# Patient Record
Sex: Female | Born: 1995 | Race: Black or African American | Hispanic: No | Marital: Single | Smoking: Never smoker
Health system: Southern US, Community
[De-identification: ages and names within clinical notes are randomized; demographics above are authoritative.]

## PROBLEM LIST (undated history)

## (undated) HISTORY — PX: KNEE SURGERY: SHX244

## (undated) HISTORY — PX: HERNIA REPAIR: SHX51

---
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
## 2016-01-12 ENCOUNTER — Emergency Department (HOSPITAL_BASED_OUTPATIENT_CLINIC_OR_DEPARTMENT_OTHER)
Admission: EM | Admit: 2016-01-12 | Discharge: 2016-01-12 | Disposition: A | Discharge status: Home / Self Care | Payer: Medicaid Other | Attending: Emergency Medicine | Admitting: Emergency Medicine

## 2016-01-12 ENCOUNTER — Encounter (HOSPITAL_BASED_OUTPATIENT_CLINIC_OR_DEPARTMENT_OTHER): Payer: Self-pay

## 2016-01-12 DIAGNOSIS — R21 Rash and other nonspecific skin eruption: Secondary | ICD-10-CM | POA: Insufficient documentation

## 2016-01-12 NOTE — ED Notes (Signed)
Left shoulder rash noted. MD seen and assessed

## 2016-01-12 NOTE — ED Provider Notes (Signed)
CSN: [id number]     Arrival date & time 01/12/16  1551 History  By signing my name below, I, Placido SouLogan Joldersma, attest that this documentation has been prepared under the direction and in the presence of Alvira MondayErin Lanae Federer, MD. Electronically Signed: Placido SouLogan Joldersma, ED Scribe. 01/12/2016. 4:27 PM.   Chief Complaint  Patient presents with  . Rash   The history is provided by the patient. No language interpreter was used.   HPI Comments: Linda French is a 20 y.o. female who presents to the Emergency Department complaining of a worsening, mild, rash to her left shoulder by 1.5 hours. She reports associated, mild, itchiness across the region. Pt denies any recent contacts with similar symptoms. Pt denies any new soaps, detergents or clothing. She denies pain across the rash, fevers, chills, cough, SOB, n/v, rhinorrhea and congestion.  History reviewed. No pertinent past medical history. Past Surgical History  Procedure Laterality Date  . Knee surgery     Family History  Problem Relation Age of Onset  . Sudden death Neg Hx   . Heart attack Neg Hx    Social History  Substance Use Topics  . Smoking status: Never Smoker   . Smokeless tobacco: None  . Alcohol Use: No   OB History    No data available     Review of Systems  Constitutional: Negative for fever and chills.  HENT: Negative for congestion and rhinorrhea.   Respiratory: Negative for cough and shortness of breath.   Gastrointestinal: Negative for nausea and vomiting.  Musculoskeletal: Negative for myalgias.  Skin: Positive for color change and rash.  All other systems reviewed and are negative.   Allergies  Review of patient's allergies indicates no known allergies.  Home Medications   Prior to Admission medications   Not on File   BP 124/74 mmHg  Pulse 88  Temp(Src) 98.8 F (37.1 C) (Oral)  Resp 18  Ht 5\' 1"  (1.549 m)  Wt 127 lb (57.607 kg)  BMI 24.01 kg/m2  SpO2 99%  LMP 01/12/2016    Physical Exam   Constitutional: She is oriented to person, place, and time. She appears well-developed and well-nourished.  HENT:  Head: Normocephalic and atraumatic.  Eyes: EOM are normal.  Neck: Normal range of motion.  Cardiovascular: Normal rate.   Pulses:      Radial pulses are 2+ on the right side, and 2+ on the left side.  Pulmonary/Chest: Effort normal. No respiratory distress.  Abdominal: Soft.  Musculoskeletal: Normal range of motion.  Neurological: She is alert and oriented to person, place, and time.  Skin: Skin is warm and dry. Rash noted. Rash is papular. No erythema.  Small raised papules to left upper arm scattered in groups. No surrounding erythema, fluctuance, scaling or vesicles.   Psychiatric: She has a normal mood and affect.  Nursing note and vitals reviewed.   ED Course  Procedures  DIAGNOSTIC STUDIES: Oxygen Saturation is 99% on RA, normal by my interpretation.    COORDINATION OF CARE: 4:24 PM Discussed next steps with pt. Return precautions noted. Pt verbalized understanding and is agreeable with the plan.   Labs Review Labs Reviewed - No data to display  Imaging Review No results found.   EKG Interpretation None      MDM   Final diagnoses:  Rash    20 year old female with no significant medical history presents with concern of rash for 1.5hr. Rash does not have the appearance of SSS, TEN, erythroderma, scabies, RMSF or hives.  Not classic appearance of heat rash. Possible insect bite vs contact dermatitis.  Recommend continued monitoring, prn benadryl. Patient discharged in stable condition with understanding of reasons to return.   I personally performed the services described in this documentation, which was scribed in my presence. The recorded information has been reviewed and is accurate.   Alvira MondayErin Pericles Carmicheal, MD 01/13/16 1038

## 2016-01-12 NOTE — ED Notes (Signed)
C/o "bumps to left arm" x 1 hour-NAD-steady gait

## 2016-07-14 ENCOUNTER — Emergency Department (HOSPITAL_BASED_OUTPATIENT_CLINIC_OR_DEPARTMENT_OTHER)
Admission: EM | Admit: 2016-07-14 | Discharge: 2016-07-14 | Disposition: A | Discharge status: Home / Self Care | Payer: Medicaid Other | Attending: Emergency Medicine | Admitting: Emergency Medicine

## 2016-07-14 ENCOUNTER — Encounter (HOSPITAL_BASED_OUTPATIENT_CLINIC_OR_DEPARTMENT_OTHER): Payer: Self-pay | Admitting: *Deleted

## 2016-07-14 DIAGNOSIS — R05 Cough: Secondary | ICD-10-CM | POA: Diagnosis present

## 2016-07-14 DIAGNOSIS — B9789 Other viral agents as the cause of diseases classified elsewhere: Secondary | ICD-10-CM

## 2016-07-14 DIAGNOSIS — J069 Acute upper respiratory infection, unspecified: Secondary | ICD-10-CM | POA: Diagnosis not present

## 2016-07-14 MED ORDER — DM-GUAIFENESIN ER 30-600 MG PO TB12: 1.0000 | ORAL_TABLET | Freq: Two times a day (BID) | ORAL | 0 refills | Status: DC | PRN

## 2016-07-14 MED ORDER — LORATADINE 10 MG PO TABS: 10.0000 mg | ORAL_TABLET | Freq: Every day | ORAL | 0 refills | Status: DC

## 2016-07-14 NOTE — ED Provider Notes (Signed)
[facility]   CSN: [id number] Arrival date & time: 07/14/16  1814  By signing my name below, I, Bing NeighborsMaurice Deon Copeland Jr., attest that this documentation has been prepared under the direction and in the presence of No att. providers found. Electronically signed: Bing NeighborsMaurice Deon Copeland Jr., ED Scribe. 07/14/16. 7:52 PM.   History   Chief Complaint Chief Complaint  Patient presents with  . URI    HPI  HPI Comments: Linda French is a 20 y.o. female who presents to the Emergency Department complaining of URI onset x2 days. Pt states that she started experiencing cold symptoms 3 days ago. She reports associated headache, sneezing, cough, sore throat and congestion. Pt denies any modifying factors.    The history is provided by the patient. No language interpreter was used.    History reviewed. No pertinent past medical history.  Patient Active Problem List   Diagnosis Date Noted  . Sports physical 01/02/2013    Past Surgical History:  Procedure Laterality Date  . KNEE SURGERY      OB History    No data available       Home Medications    Prior to Admission medications   Medication Sig Start Date End Date Taking? Authorizing Provider  dextromethorphan-guaiFENesin (MUCINEX DM) 30-600 MG 12hr tablet Take 1 tablet by mouth 2 (two) times daily as needed for cough. 07/14/16   Lyndal Pulleyaniel Jewell Haught, MD  loratadine (CLARITIN) 10 MG tablet Take 1 tablet (10 mg total) by mouth daily. 07/14/16   Lyndal Pulleyaniel Aaliyah Gavel, MD    Family History Family History  Problem Relation Age of Onset  . Sudden death Neg Hx   . Heart attack Neg Hx     Social History Social History  Substance Use Topics  . Smoking status: Never Smoker  . Smokeless tobacco: Not on file  . Alcohol use No     Allergies   Patient has no known allergies.   Review of Systems Review of Systems  Constitutional: Negative for chills and fever.  HENT: Positive for congestion, sneezing and sore  throat. Negative for ear pain.   Eyes: Negative for pain and visual disturbance.  Respiratory: Positive for cough. Negative for shortness of breath.   Cardiovascular: Negative for chest pain and palpitations.  Gastrointestinal: Negative for abdominal pain and vomiting.  Genitourinary: Negative for dysuria and hematuria.  Musculoskeletal: Negative for arthralgias and back pain.  Skin: Negative for color change and rash.  Neurological: Positive for headaches. Negative for seizures and syncope.  All other systems reviewed and are negative.    Physical Exam Updated Vital Signs BP 116/80   Pulse 86   Temp 98.1 F (36.7 C) (Oral)   Resp 18   Ht 5\' 2"  (1.575 m)   Wt 124 lb (56.2 kg)   SpO2 100%   BMI 22.68 kg/m   Physical Exam  Constitutional: She is oriented to person, place, and time. She appears well-developed and well-nourished. No distress.  HENT:  Head: Normocephalic.  Nose: Nose normal.  Eyes: Conjunctivae are normal.  Neck: Neck supple. No tracheal deviation present.  Cardiovascular: Normal rate, regular rhythm and normal heart sounds.   Pulmonary/Chest: Effort normal and breath sounds normal. No respiratory distress.  Abdominal: Soft. She exhibits no distension.  Neurological: She is alert and oriented to person, place, and time.  Skin: Skin is warm and dry.  Psychiatric: She has a normal mood and affect.     ED Treatments / Results   DIAGNOSTIC  STUDIES: Oxygen Saturation is 100% on RA, normal by my interpretation.   COORDINATION OF CARE: 8:11 PM-Discussed next steps with pt. Pt verbalized understanding and is agreeable with the plan.    Labs (all labs ordered are listed, but only abnormal results are displayed) Labs Reviewed - No data to display  EKG  EKG Interpretation None       Radiology No results found.  Procedures Procedures (including critical care time)  Medications Ordered in ED Medications - No data to display   Initial Impression  / Assessment and Plan / ED Course  I have reviewed the triage vital signs and the nursing notes.  Pertinent labs & imaging results that were available during my care of the patient were reviewed by me and considered in my medical decision making (see chart for details).  Clinical Course     20 y.o. female presents with cough for last 3 days with congestion. No signs of respiratory distress, non-toxic appearing, CTAB, no concern for pneumonia with this clinical picture. No emergent testing indicated at this time. Pt discharged with likely viral cough which will be self limited in its course. Discussed supportive care measures. Plan to follow up with PCP as needed and return precautions discussed for worsening or new concerning symptoms.   Final Clinical Impressions(s) / ED Diagnoses   Final diagnoses:  Viral URI with cough    New Prescriptions Discharge Medication List as of 07/14/2016  7:16 PM    START taking these medications   Details  dextromethorphan-guaiFENesin (MUCINEX DM) 30-600 MG 12hr tablet Take 1 tablet by mouth 2 (two) times daily as needed for cough., Starting Wed 07/14/2016, Print    loratadine (CLARITIN) 10 MG tablet Take 1 tablet (10 mg total) by mouth daily., Starting Wed 07/14/2016, Print        I personally performed the services described in this documentation, which was scribed in my presence. The recorded information has been reviewed and is accurate.    Lyndal Pulleyaniel Yelina Sarratt, MD 07/15/16 [phone]

## 2016-07-14 NOTE — ED Triage Notes (Signed)
Pt c/o URi symptoms x 3 days  

## 2016-07-14 NOTE — ED Notes (Signed)
Pt verbalizes understanding of d/c instructions and denies any further needs at this time. 

## 2016-07-14 NOTE — ED Notes (Signed)
Pt states she has had cough, H/A, sneezing, occasional sore throat and hoarseness yesterday. Symptoms have been going on a couple days.

## 2016-12-16 ENCOUNTER — Encounter (HOSPITAL_BASED_OUTPATIENT_CLINIC_OR_DEPARTMENT_OTHER): Payer: Self-pay | Admitting: *Deleted

## 2016-12-16 ENCOUNTER — Emergency Department (HOSPITAL_BASED_OUTPATIENT_CLINIC_OR_DEPARTMENT_OTHER)
Admission: EM | Admit: 2016-12-16 | Discharge: 2016-12-16 | Disposition: A | Discharge status: Home / Self Care | Payer: Medicaid Other | Attending: Emergency Medicine | Admitting: Emergency Medicine

## 2016-12-16 DIAGNOSIS — J029 Acute pharyngitis, unspecified: Secondary | ICD-10-CM | POA: Insufficient documentation

## 2016-12-16 DIAGNOSIS — R070 Pain in throat: Secondary | ICD-10-CM | POA: Diagnosis present

## 2016-12-16 LAB — RAPID STREP SCREEN (MED CTR MEBANE ONLY): STREPTOCOCCUS, GROUP A SCREEN (DIRECT): NEGATIVE

## 2016-12-16 MED ORDER — KETOROLAC TROMETHAMINE 30 MG/ML IJ SOLN
30.0000 mg | Freq: Once | INTRAMUSCULAR | Status: AC
  Administered 2016-12-16: 30 mg via INTRAVENOUS
  Filled 2016-12-16: qty 1

## 2016-12-16 MED ORDER — MORPHINE SULFATE (PF) 4 MG/ML IV SOLN: 4.0000 mg | Freq: Once | INTRAVENOUS | Status: DC

## 2016-12-16 MED ORDER — HYDROCODONE-ACETAMINOPHEN 7.5-325 MG/15ML PO SOLN: 10.0000 mL | Freq: Four times a day (QID) | ORAL | 0 refills | Status: DC | PRN

## 2016-12-16 MED ORDER — ACETAMINOPHEN 500 MG PO TABS
1000.0000 mg | ORAL_TABLET | Freq: Once | ORAL | Status: AC
  Administered 2016-12-16: 1000 mg via ORAL
  Filled 2016-12-16: qty 2

## 2016-12-16 MED ORDER — SODIUM CHLORIDE 0.9 % IV BOLUS (SEPSIS)
1000.0000 mL | Freq: Once | INTRAVENOUS | Status: AC
  Administered 2016-12-16: 1000 mL via INTRAVENOUS

## 2016-12-16 MED ORDER — ACETAMINOPHEN 500 MG PO TABS: 1000.0000 mg | ORAL_TABLET | Freq: Once | ORAL | Status: DC

## 2016-12-16 MED FILL — HYDROCOD-APAP 7.5-325/15ML: 7.5-325 | 4 days supply | Qty: 150 | Fill #0

## 2016-12-16 NOTE — Discharge Instructions (Signed)
Read the information below.  Use the prescribed medication as directed.  Please discuss all new medications with your pharmacist.  You may return to the Emergency Department at any time for worsening condition or any new symptoms that concern you.   If you develop high fevers, difficulty swallowing or breathing, or you are unable to tolerate fluids by mouth, return to the ER immediately for a recheck.    °

## 2016-12-16 NOTE — ED Triage Notes (Signed)
Sore throat since yesterday.

## 2016-12-16 NOTE — ED Provider Notes (Signed)
[facility] Provider Note   CSN: [id number] Arrival date & time: 12/16/16  1231     History   Chief Complaint Chief Complaint  Patient presents with  . Sore Throat    HPI Linda French is a 21 y.o. female.  HPI   Pt p/w sore throat, subjective fevers that began yesterday.  No nasal symptoms.  Mild cough.  No CP, SOB.  Has not tried any medications.  Decreased PO intake secondary to pain.     History reviewed. No pertinent past medical history.  Patient Active Problem List   Diagnosis Date Noted  . Sports physical 01/02/2013    Past Surgical History:  Procedure Laterality Date  . KNEE SURGERY      OB History    No data available       Home Medications    Prior to Admission medications   Medication Sig Start Date End Date Taking? Authorizing Provider  dextromethorphan-guaiFENesin (MUCINEX DM) 30-600 MG 12hr tablet Take 1 tablet by mouth 2 (two) times daily as needed for cough. 07/14/16   Lyndal PulleyKnott, Daniel, MD  HYDROcodone-acetaminophen (HYCET) 7.5-325 mg/15 ml solution Take 10 mLs by mouth 4 (four) times daily as needed for moderate pain or severe pain. 12/16/16   Trixie DredgeWest, Rosabelle Jupin, PA-C  loratadine (CLARITIN) 10 MG tablet Take 1 tablet (10 mg total) by mouth daily. 07/14/16   Lyndal PulleyKnott, Daniel, MD    Family History Family History  Problem Relation Age of Onset  . Sudden death Neg Hx   . Heart attack Neg Hx     Social History Social History  Substance Use Topics  . Smoking status: Never Smoker  . Smokeless tobacco: Never Used  . Alcohol use No     Allergies   Patient has no known allergies.   Review of Systems Review of Systems  All other systems reviewed and are negative.    Physical Exam Updated Vital Signs BP 118/69 (BP Location: Right Arm)   Pulse 97   Temp 97.5 F (36.4 C) (Oral)   Resp 16   Ht 5\' 2"  (1.575 m)   Wt 58.1 kg (128 lb)   SpO2 99%   BMI 23.41 kg/m   Physical Exam  Constitutional: She appears well-developed and  well-nourished. No distress.  HENT:  Head: Normocephalic and atraumatic.  Mouth/Throat: Uvula is midline and mucous membranes are normal. Oropharyngeal exudate, posterior oropharyngeal edema and posterior oropharyngeal erythema present. No tonsillar abscesses.  Eyes: Conjunctivae are normal.  Neck: Normal range of motion. Neck supple.  Cardiovascular: Normal rate and regular rhythm.   Pulmonary/Chest: Effort normal and breath sounds normal. No stridor. No respiratory distress. She has no wheezes. She has no rales.  Lymphadenopathy:    She has no cervical adenopathy.  Neurological: She is alert.  Skin: She is not diaphoretic.  Nursing note and vitals reviewed.    ED Treatments / Results  Labs (all labs ordered are listed, but only abnormal results are displayed) Labs Reviewed  RAPID STREP SCREEN (NOT AT [facility])  CULTURE, GROUP A STREP [facility])    EKG  EKG Interpretation None       Radiology No results found.  Procedures Procedures (including critical care time)  Medications Ordered in ED Medications  acetaminophen (TYLENOL) tablet 1,000 mg (1,000 mg Oral Given 12/16/16 1301)  sodium chloride 0.9 % bolus 1,000 mL (0 mLs Intravenous Stopped 12/16/16 1455)  ketorolac (TORADOL) 30 MG/ML injection 30 mg (30 mg Intravenous Given 12/16/16 1356)     Initial  Impression / Assessment and Plan / ED Course  I have reviewed the triage vital signs and the nursing notes.  Pertinent labs & imaging results that were available during my care of the patient were reviewed by me and considered in my medical decision making (see chart for details).     Afebrile, nontoxic patient with sore throat.  Strep screen negative.  IVF, pain medication given.  No airway concerns.  Doubt peritonsillar abscess.   D/C home with  pain medication, PCP follow up.  Culture pending.  Discussed result, findings, treatment, and follow up  with patient.  Pt given return precautions.  Pt verbalizes understanding and  agrees with plan.       Final Clinical Impressions(s) / ED Diagnoses   Final diagnoses:  Pharyngitis, unspecified etiology    New Prescriptions Discharge Medication List as of 12/16/2016  2:32 PM    START taking these medications   Details  HYDROcodone-acetaminophen (HYCET) 7.5-325 mg/15 ml solution Take 10 mLs by mouth 4 (four) times daily as needed for moderate pain or severe pain., Starting Thu 12/16/2016, [address] [place], PA-C 12/16/16 1610    Geoffery Lyons, MD 12/17/16 [phone]

## 2016-12-18 LAB — CULTURE, GROUP A STREP (THRC)

## 2016-12-20 ENCOUNTER — Encounter (HOSPITAL_BASED_OUTPATIENT_CLINIC_OR_DEPARTMENT_OTHER): Payer: Self-pay | Admitting: Emergency Medicine

## 2016-12-20 ENCOUNTER — Emergency Department (HOSPITAL_BASED_OUTPATIENT_CLINIC_OR_DEPARTMENT_OTHER): Payer: Medicaid Other

## 2016-12-20 ENCOUNTER — Emergency Department (HOSPITAL_BASED_OUTPATIENT_CLINIC_OR_DEPARTMENT_OTHER)
Admission: EM | Admit: 2016-12-20 | Discharge: 2016-12-20 | Disposition: A | Discharge status: Home / Self Care | Payer: Medicaid Other | Attending: Emergency Medicine | Admitting: Emergency Medicine

## 2016-12-20 DIAGNOSIS — J039 Acute tonsillitis, unspecified: Secondary | ICD-10-CM | POA: Insufficient documentation

## 2016-12-20 DIAGNOSIS — J029 Acute pharyngitis, unspecified: Secondary | ICD-10-CM | POA: Diagnosis present

## 2016-12-20 DIAGNOSIS — Z79899 Other long term (current) drug therapy: Secondary | ICD-10-CM | POA: Insufficient documentation

## 2016-12-20 LAB — BASIC METABOLIC PANEL
ANION GAP: 9 (ref 5–15)
BUN: 11 mg/dL (ref 6–20)
CHLORIDE: 103 mmol/L (ref 101–111)
CO2: 25 mmol/L (ref 22–32)
Calcium: 9.5 mg/dL (ref 8.9–10.3)
Creatinine, Ser: 0.72 mg/dL (ref 0.44–1.00)
GFR calc Af Amer: >60 mL/min (ref >60)
GLUCOSE: 93 mg/dL (ref 65–99)
POTASSIUM: 3.4 mmol/L — AB (ref 3.5–5.1)
SODIUM: 137 mmol/L (ref 135–145)

## 2016-12-20 LAB — CBC WITH DIFFERENTIAL/PLATELET
BASOS PCT: 0 %
Basophils Absolute: 0 10*3/uL (ref 0.0–0.1)
EOS PCT: 1 %
Eosinophils Absolute: 0.1 10*3/uL (ref 0.0–0.7)
HCT: 33.8 % — ABNORMAL LOW (ref 36.0–46.0)
HEMOGLOBIN: 11.3 g/dL — AB (ref 12.0–15.0)
LYMPHS PCT: 12 %
Lymphs Abs: 1.3 10*3/uL (ref 0.7–4.0)
MCH: 22 pg — ABNORMAL LOW (ref 26.0–34.0)
MCHC: 33.4 g/dL (ref 30.0–36.0)
MCV: 65.8 fL — AB (ref 78.0–100.0)
MONO ABS: 1.3 10*3/uL — AB (ref 0.1–1.0)
Monocytes Relative: 12 %
NEUTROS PCT: 75 %
Neutro Abs: 8.2 10*3/uL — ABNORMAL HIGH (ref 1.7–7.7)
PLATELETS: 389 10*3/uL (ref 150–400)
RBC: 5.14 MIL/uL — AB (ref 3.87–5.11)
RDW: 14.2 % (ref 11.5–15.5)
WBC: 10.9 10*3/uL — AB (ref 4.0–10.5)

## 2016-12-20 LAB — MONONUCLEOSIS SCREEN: Mono Screen: NEGATIVE

## 2016-12-20 LAB — PREGNANCY, URINE: PREG TEST UR: NEGATIVE

## 2016-12-20 MED ORDER — DEXAMETHASONE SODIUM PHOSPHATE 10 MG/ML IJ SOLN
10.0000 mg | Freq: Once | INTRAMUSCULAR | Status: AC
  Administered 2016-12-20: 10 mg via INTRAVENOUS
  Filled 2016-12-20: qty 1

## 2016-12-20 MED ORDER — KETOROLAC TROMETHAMINE 15 MG/ML IJ SOLN
15.0000 mg | Freq: Once | INTRAMUSCULAR | Status: AC
  Administered 2016-12-20: 15 mg via INTRAVENOUS
  Filled 2016-12-20: qty 1

## 2016-12-20 MED ORDER — AMOXICILLIN-POT CLAVULANATE 875-125 MG PO TABS: 1.0000 | ORAL_TABLET | Freq: Two times a day (BID) | ORAL | 0 refills | Status: AC

## 2016-12-20 MED ORDER — IOPAMIDOL (ISOVUE-300) INJECTION 61%
100.0000 mL | Freq: Once | INTRAVENOUS | Status: AC | PRN
  Administered 2016-12-20: 100 mL via INTRAVENOUS

## 2016-12-20 MED ORDER — SODIUM CHLORIDE 0.9 % IV BOLUS (SEPSIS)
1000.0000 mL | Freq: Once | INTRAVENOUS | Status: AC
  Administered 2016-12-20: 1000 mL via INTRAVENOUS

## 2016-12-20 MED FILL — AMOX-CLAV 875-125 MG TABLET: 875-125 | 7 days supply | Qty: 14 | Fill #0

## 2016-12-20 NOTE — ED Notes (Signed)
Patient transported to CT 

## 2016-12-20 NOTE — ED Provider Notes (Signed)
[facility]   CSN: [id number] Arrival date & time: 12/20/16  4098     History   Chief Complaint Chief Complaint  Patient presents with  . Sore Throat    HPI Linda French is a 21 y.o. female.  HPI   Was seen Thursday for sore throat. Started on Wednesday. Left side is sore, pain 10/10.  Fever Wed-Fri, no fever since then.  No nausea/vomiting.  Hoarse voice here and there throughout the day.  Reports is drooling.  Painful eating and drinking so not doing as much.  No other known sick contacts. Reports taking hydrocodone without relief.     History reviewed. No pertinent past medical history.  Patient Active Problem List   Diagnosis Date Noted  . Sports physical 01/02/2013    Past Surgical History:  Procedure Laterality Date  . KNEE SURGERY      OB History    No data available       Home Medications    Prior to Admission medications   Medication Sig Start Date End Date Taking? Authorizing Provider  dextromethorphan-guaiFENesin (MUCINEX DM) 30-600 MG 12hr tablet Take 1 tablet by mouth 2 (two) times daily as needed for cough. 07/14/16  Yes Lyndal Pulley, MD  HYDROcodone-acetaminophen (HYCET) 7.5-325 mg/15 ml solution Take 10 mLs by mouth 4 (four) times daily as needed for moderate pain or severe pain. 12/16/16  Yes West, Emily, PA-C  loratadine (CLARITIN) 10 MG tablet Take 1 tablet (10 mg total) by mouth daily. 07/14/16  Yes Lyndal Pulley, MD  amoxicillin-clavulanate (AUGMENTIN) 875-125 MG tablet Take 1 tablet by mouth every 12 (twelve) hours. 12/20/16 12/27/16  Alvira Monday, MD    Family History Family History  Problem Relation Age of Onset  . Sudden death Neg Hx   . Heart attack Neg Hx     Social History Social History  Substance Use Topics  . Smoking status: Never Smoker  . Smokeless tobacco: Never Used  . Alcohol use No     Allergies   Patient has no known allergies.   Review of Systems Review of Systems    Constitutional: Positive for fatigue. Negative for fever (now resolved).  HENT: Positive for congestion, rhinorrhea, sore throat and trouble swallowing.   Eyes: Negative for visual disturbance.  Respiratory: Negative for cough and shortness of breath.   Cardiovascular: Negative for chest pain.  Gastrointestinal: Negative for abdominal pain, nausea and vomiting.  Genitourinary: Negative for difficulty urinating.  Musculoskeletal: Negative for back pain and neck pain.  Skin: Negative for rash.  Neurological: Negative for syncope and headaches.     Physical Exam Updated Vital Signs BP 124/80 (BP Location: Left Arm)   Pulse 92   Temp 98.8 F (37.1 C) (Oral)   Resp 18   Ht 5\' 2"  (1.575 m)   Wt 58.1 kg (128 lb)   SpO2 98%   BMI 23.41 kg/m   Physical Exam  Constitutional: She is oriented to person, place, and time. She appears well-developed and well-nourished. No distress.  HENT:  Head: Normocephalic and atraumatic.  Mouth/Throat: Oropharyngeal exudate present.  Left tonsil with 1cm defect, appearance of other tonsil protrusion or growth, mild peritonsillar swelling, significant purulent material posterior oropharynx on left  Eyes: Conjunctivae and EOM are normal.  Neck: Normal range of motion. No neck rigidity. Normal range of motion present.  Cardiovascular: Normal rate, regular rhythm, normal heart sounds and intact distal pulses.  Exam reveals no gallop and no friction rub.   No  murmur heard. Pulmonary/Chest: Effort normal and breath sounds normal. No stridor. No respiratory distress. She has no wheezes. She has no rales.  Abdominal: Soft. She exhibits no distension. There is no tenderness. There is no guarding.  Musculoskeletal: She exhibits no edema or tenderness.  Lymphadenopathy:    She has cervical adenopathy.       Left cervical: Superficial cervical adenopathy present.  Neurological: She is alert and oriented to person, place, and time.  Skin: Skin is warm and dry.  No rash noted. She is not diaphoretic. No erythema.  Nursing note and vitals reviewed.    ED Treatments / Results  Labs (all labs ordered are listed, but only abnormal results are displayed) Labs Reviewed  CBC WITH DIFFERENTIAL/PLATELET - Abnormal; Notable for the following:       Result Value   WBC 10.9 (*)    RBC 5.14 (*)    Hemoglobin 11.3 (*)    HCT 33.8 (*)    MCV 65.8 (*)    MCH 22.0 (*)    Neutro Abs 8.2 (*)    Monocytes Absolute 1.3 (*)    All other components within normal limits  BASIC METABOLIC PANEL - Abnormal; Notable for the following:    Potassium 3.4 (*)    All other components within normal limits  MONONUCLEOSIS SCREEN  PREGNANCY, URINE    EKG  EKG Interpretation None       Radiology Ct Soft Tissue Neck W Contrast  Result Date: 12/20/2016 CLINICAL DATA:  21 year old female with left side sore throat for 5 days. Swollen purulent appearing tonsils. EXAM: CT NECK WITH CONTRAST TECHNIQUE: Multidetector CT imaging of the neck was performed using the standard protocol following the bolus administration of intravenous contrast. CONTRAST:  100mL ISOVUE-300 IOPAMIDOL (ISOVUE-300) INJECTION 61% COMPARISON:  None. FINDINGS: Pharynx and larynx: Negative larynx. Pharyngeal mucosal space edema throughout. Mild adenoid hypertrophy and hyperenhancement. Left greater than right palatine tonsillar enlargement and heterogeneity (series 3, image 26). Associated left greater than right parapharyngeal space edema and mild to moderate retropharyngeal effusion with evidence of superimposed retropharyngeal lymphadenopathy (series 3, image 34 and series 5, image 40). No discrete tonsillar or retropharyngeal abscess. Salivary glands: Negative sublingual space. Mild secondary inflammation at the posterior submandibular spaces, but the submandibular glands appear normal. Negative parotid glands. Thyroid: Negative. Lymph nodes: Left greater than right level 2 lymphadenopathy. Left side nodes  up to 14 mm short axis. Right side nodes up to 10 mm. Lesser bilateral level 3 and level 1b lymph node enlargement. No cystic or necrotic nodes. Vascular: Major vascular structures in the neck and at the skullbase are patent, including both internal jugular veins. Limited intracranial: Negative. Visualized orbits: Negative. Mastoids and visualized paranasal sinuses: Trace bubbly opacity in the right maxillary sinus. Other visible paranasal sinuses and mastoids are well pneumatized. Skeleton: No dental abnormality identified. No osseous abnormality identified. Upper chest: Normal lung apices. IMPRESSION: 1. Acute tonsillitis. Widespread parapharyngeal and retropharyngeal space edema, but no tonsillar or neck abscess at this time. 2. Reactive left greater than right cervical lymphadenopathy. No suppurative lymph nodes or other complicating features. Electronically Signed   By: Odessa FlemingH  Hall M.D.   On: 12/20/2016 08:20    Procedures Procedures (including critical care time)  Medications Ordered in ED Medications  sodium chloride 0.9 % bolus 1,000 mL (0 mLs Intravenous Stopped 12/20/16 0821)  iopamidol (ISOVUE-300) 61 % injection 100 mL (100 mLs Intravenous Contrast Given 12/20/16 0759)  dexamethasone (DECADRON) injection 10 mg (10 mg Intravenous Given  12/20/16 0840)  ketorolac (TORADOL) 15 MG/ML injection 15 mg (15 mg Intravenous Given 12/20/16 0840)     Initial Impression / Assessment and Plan / ED Course  I have reviewed the triage vital signs and the nursing notes.  Pertinent labs & imaging results that were available during my care of the patient were reviewed by me and considered in my medical decision making (see chart for details).    20 year old female presents with concern for severe left-sided sore throat. Patient was seen on Thursday for the same, had a negative strep test, and was given rx for hydrocodone. Reports no relief with treatment.  Strep culture negative today.  Unusual appearance to  posterior oropharynx, concern for possible spontaneous rupture of peritonsillar abscess, significant purulence to posterior oropharynx, considering postnasal drip versus drainage of abscess.  Given severe pain, unusual exam, will obtain CT to evaluate for PTA or other abnormalities.  Monospot and basic labs ordered.   Labs show mild leukocytosis, negative pregnancy test, no other significant abnormalities. CT shows acute tonsillitis with widespread edema, but no identifiable abscess.  Given significant swelling, findings on exam have suspicion for bacterial infection, and given prescription for Augmentin. Given decadron and toradol in ED.  Recommend ibuprofen and tylenol for pain. Provided number for ENT follow up if symptoms persist or worsen.  Discussed reasons to return to ED in detail.  Recommend PCP follow up for recheck tonsils if symptoms improved to ensure appearance returns to normal. Patient discharged in stable condition with understanding of reasons to return.    Final Clinical Impressions(s) / ED Diagnoses   Final diagnoses:  Tonsillitis    New Prescriptions New Prescriptions   AMOXICILLIN-CLAVULANATE (AUGMENTIN) 875-125 MG TABLET    Take 1 tablet by mouth every 12 (twelve) hours.     Alvira Monday, MD 12/20/16 [phone]

## 2016-12-20 NOTE — Discharge Instructions (Signed)
I recommend using ibuprofen 400mg -800mg  4 times daily, tylenol up to 1000mg  4 times daily.  (the hydrocodone you were prescribed previously has tylenol in it so make sure you take no more than 4000mg  of tylenol daily from all sources)

## 2016-12-20 NOTE — ED Notes (Signed)
ED Provider at bedside. 

## 2016-12-20 NOTE — ED Triage Notes (Signed)
Pt was seen 5/31 for sore throat. Reports no improvement. Tonsils red, swollen, and appears to have pus.

## 2018-11-17 IMAGING — CT CT NECK W/ CM
3 of 4 series · 13 of 33 positions shown, 16 images · IV contrast (iopamidol)
Comparison: None.

CLINICAL DATA: 21-year-old female with left side sore throat for 5
days. Swollen purulent appearing tonsils.

EXAM:
CT NECK WITH CONTRAST
TECHNIQUE: Multidetector CT imaging of the neck was performed using the
standard protocol following the bolus administration of intravenous
contrast.
CONTRAST:  100mL K8L1GB-333 IOPAMIDOL (K8L1GB-333) INJECTION 61%

[Series 5: sag neck · sagittal · 0.47mm/px · 5 of 83 slices shown, 6 images]
[im 28/83  bone]
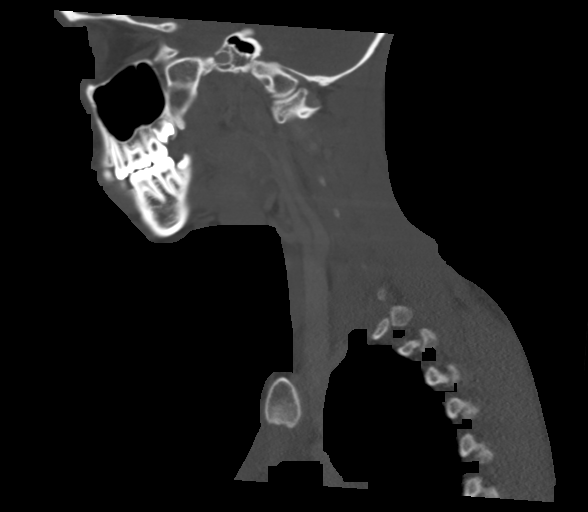
[im 35/83  bone]
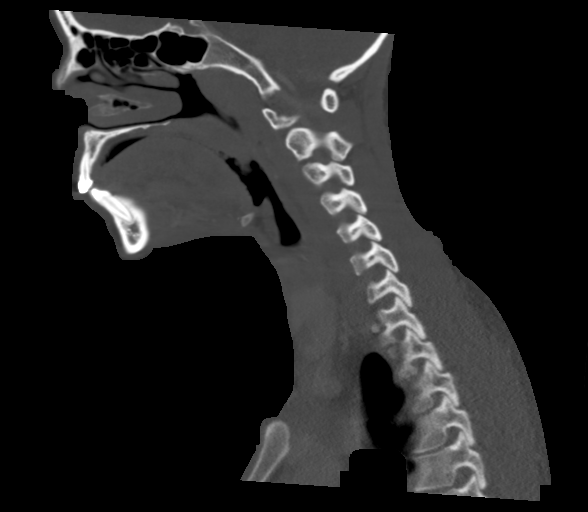
[im 42/83  soft-tissue]
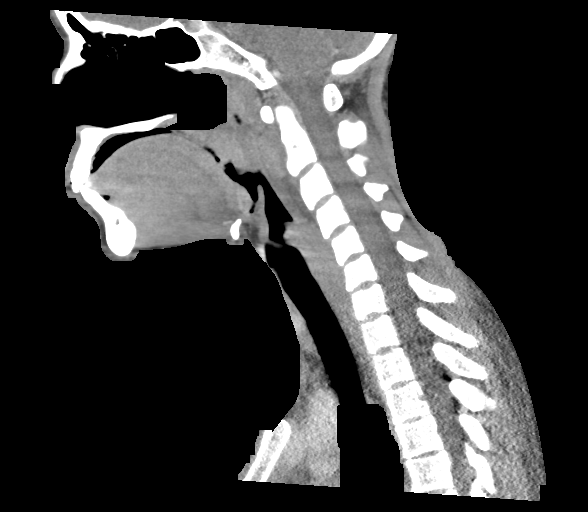
[im 42/83  bone]
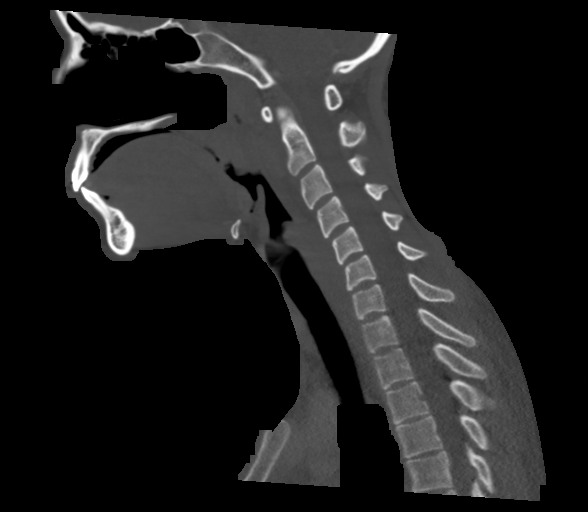
[im 48/83  bone]
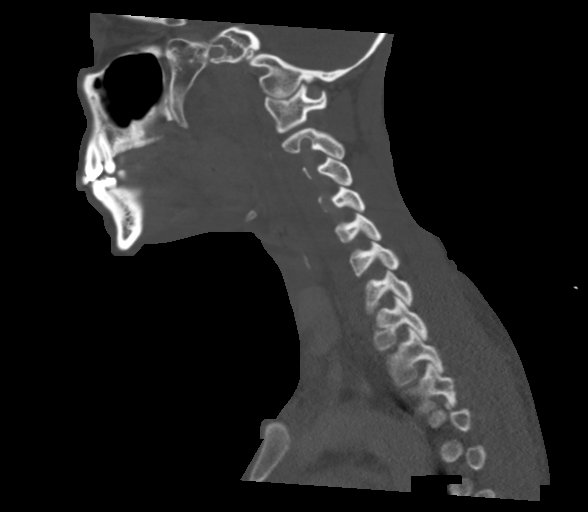
[im 55/83  bone]
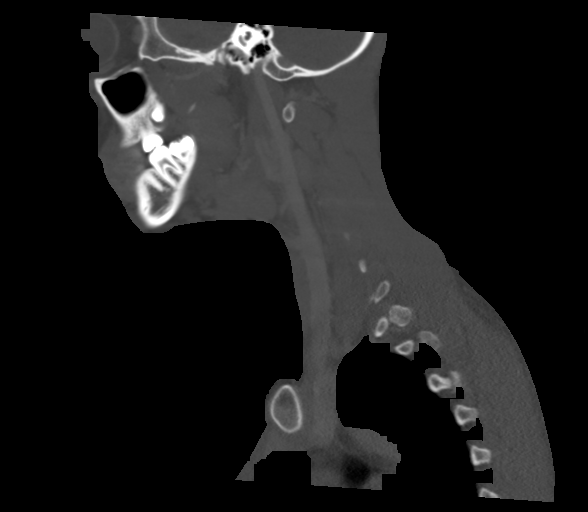

[Series 6: cor neck · coronal · 0.40mm/px · 3 of 139 slices shown]
[im 28/139  bone]
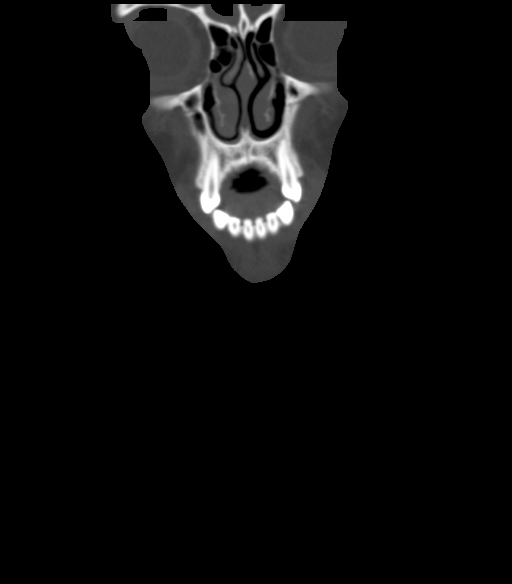
[im 56/139  bone]
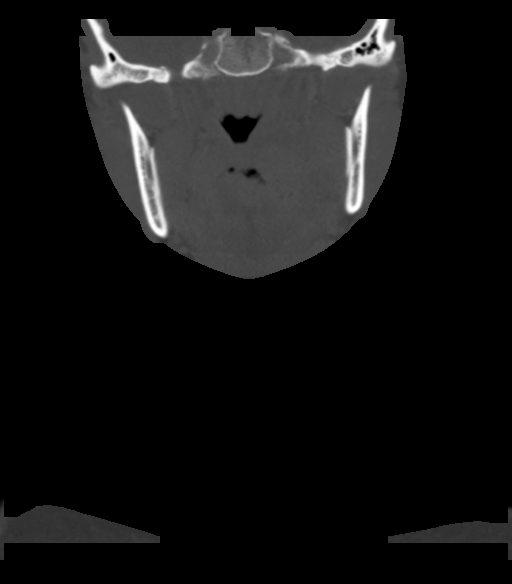
[im 83/139  bone]
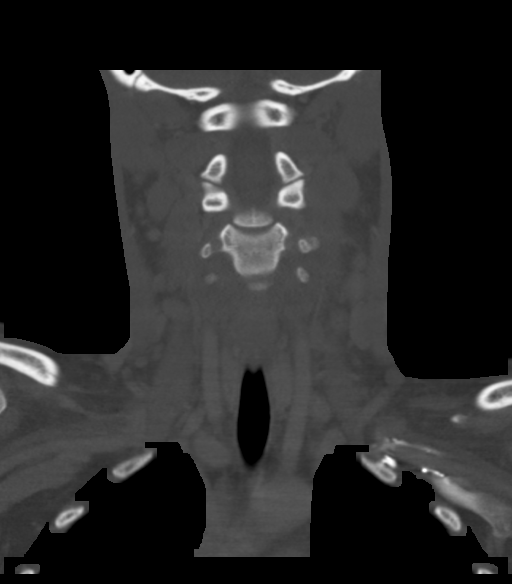

[Series 7: orthogonal ax · axial · 0.48mm/px · z∈[-275,-115]mm · 5 of 123 slices shown, 7 images]
[im 21/123  soft-tissue]
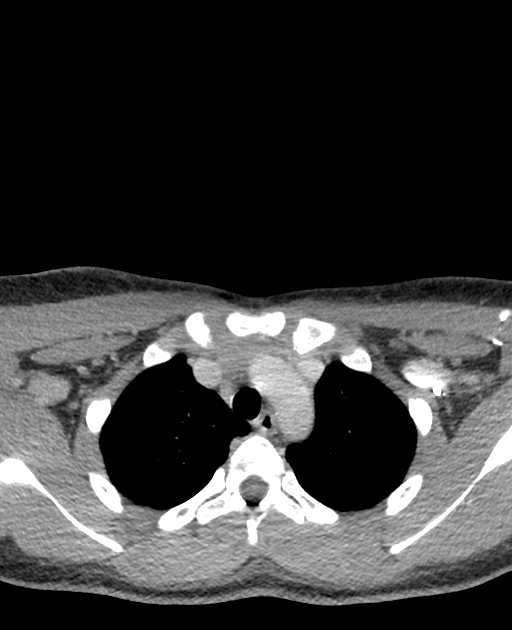
[im 21/123  bone]
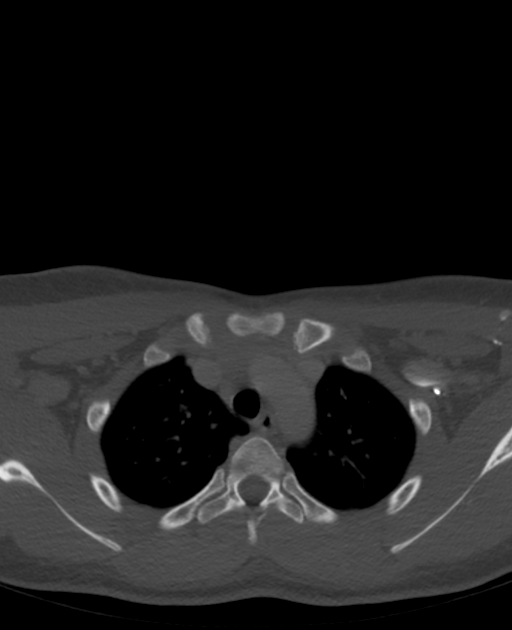
[im 41/123  bone]
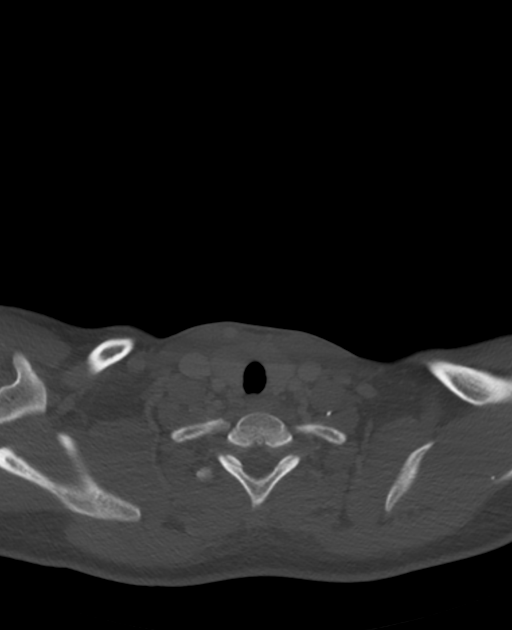
[im 62/123  bone]
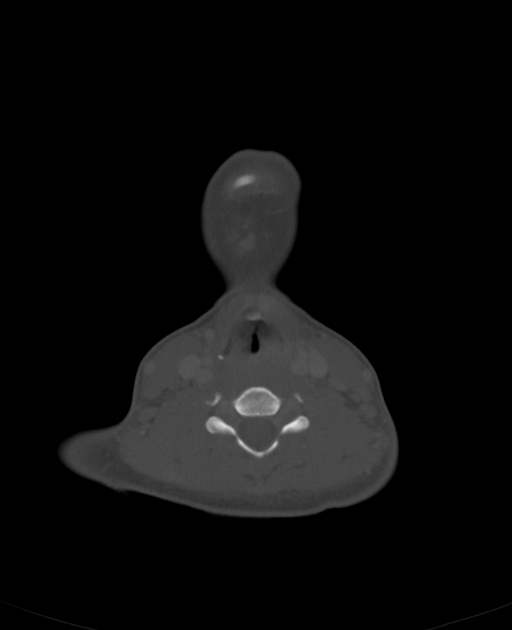
[im 82/123  bone]
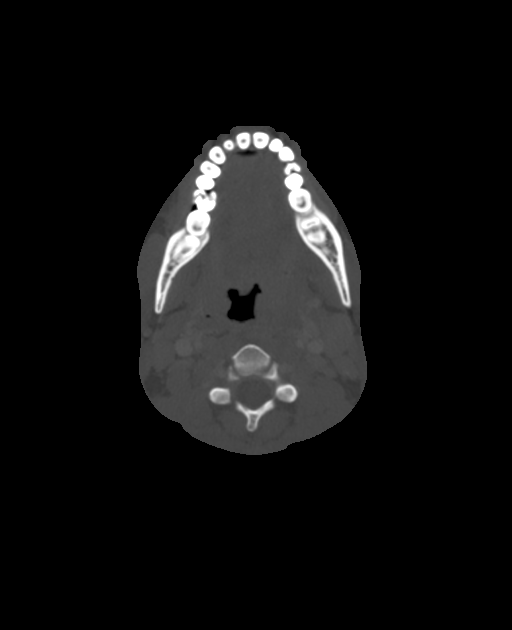
[im 102/123  soft-tissue]
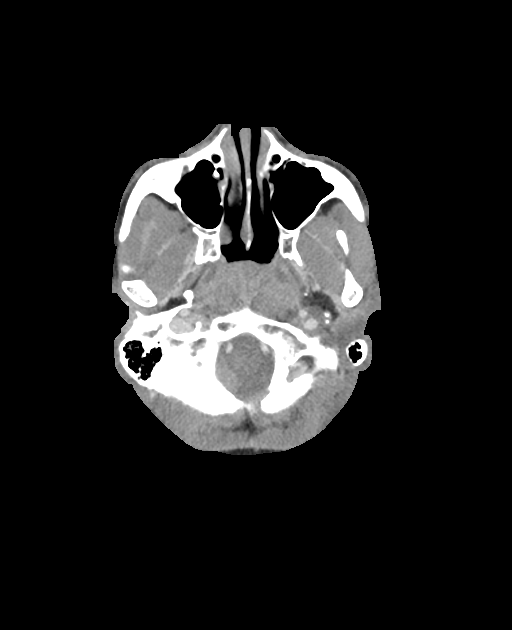
[im 102/123  bone]
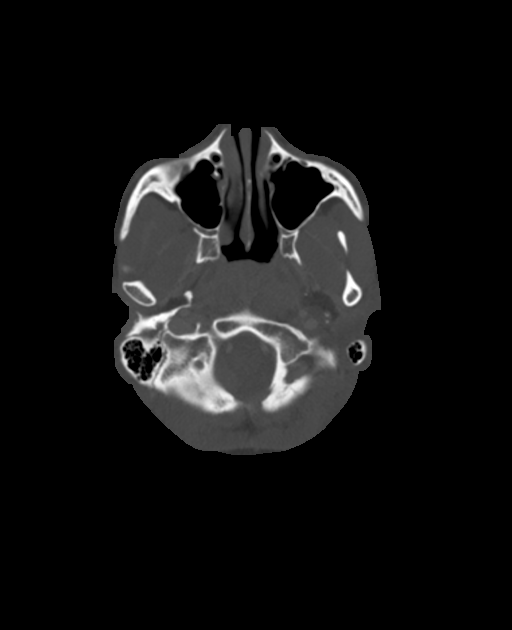

[13 of 33 positions shown; findings below may reference images not displayed]

FINDINGS: Pharynx and larynx: Negative larynx. Pharyngeal mucosal space edema
throughout. Mild adenoid hypertrophy and hyperenhancement. Left
greater than right palatine tonsillar enlargement and heterogeneity
(series 3, image 26). Associated left greater than right
parapharyngeal space edema and mild to moderate retropharyngeal
effusion with evidence of superimposed retropharyngeal
lymphadenopathy (series 3, image 34 and series 5, image 40). No
discrete tonsillar or retropharyngeal abscess.

Salivary glands: Negative sublingual space. Mild secondary
inflammation at the posterior submandibular spaces, but the
submandibular glands appear normal. Negative parotid glands.

Thyroid: Negative.

Lymph nodes: Left greater than right level 2 lymphadenopathy. Left
side nodes up to 14 mm short axis. Right side nodes up to 10 mm.
Lesser bilateral level 3 and level 1b lymph node enlargement. No
cystic or necrotic nodes.

Vascular: Major vascular structures in the neck and at the skullbase
are patent, including both internal jugular veins.

Limited intracranial: Negative.

Visualized orbits: Negative.

Mastoids and visualized paranasal sinuses: Trace bubbly opacity in
the right maxillary sinus. Other visible paranasal sinuses and
mastoids are well pneumatized.

Skeleton: No dental abnormality identified. No osseous abnormality
identified.

Upper chest: Normal lung apices.
IMPRESSION: 1. Acute tonsillitis. Widespread parapharyngeal and retropharyngeal
space edema, but no tonsillar or neck abscess at this time.
2. Reactive left greater than right cervical lymphadenopathy. No
suppurative lymph nodes or other complicating features.

## 2020-01-02 ENCOUNTER — Emergency Department (HOSPITAL_BASED_OUTPATIENT_CLINIC_OR_DEPARTMENT_OTHER)
Admission: EM | Admit: 2020-01-02 | Discharge: 2020-01-02 | Discharge status: Against Medical Advice | Payer: 59 | Attending: Emergency Medicine | Admitting: Emergency Medicine

## 2020-01-02 ENCOUNTER — Encounter (HOSPITAL_BASED_OUTPATIENT_CLINIC_OR_DEPARTMENT_OTHER): Payer: Self-pay | Admitting: *Deleted

## 2020-01-02 ENCOUNTER — Other Ambulatory Visit: Payer: Self-pay

## 2020-01-02 DIAGNOSIS — R110 Nausea: Secondary | ICD-10-CM | POA: Diagnosis not present

## 2020-01-02 DIAGNOSIS — R079 Chest pain, unspecified: Secondary | ICD-10-CM | POA: Diagnosis present

## 2020-01-02 DIAGNOSIS — Z5321 Procedure and treatment not carried out due to patient leaving prior to being seen by health care provider: Secondary | ICD-10-CM | POA: Insufficient documentation

## 2020-01-02 DIAGNOSIS — R0602 Shortness of breath: Secondary | ICD-10-CM | POA: Diagnosis not present

## 2020-01-02 NOTE — ED Notes (Signed)
Pt informed registration she was leaving. Pt given copy of EKG as requested.

## 2020-01-02 NOTE — ED Triage Notes (Signed)
Bilateral Chest pain x 1 month  Comes and goes, pain increased w movement nausea x 1 day  Shortness of breath at times

## 2020-01-03 ENCOUNTER — Emergency Department (HOSPITAL_BASED_OUTPATIENT_CLINIC_OR_DEPARTMENT_OTHER)
Admission: EM | Admit: 2020-01-03 | Discharge: 2020-01-03 | Disposition: A | Discharge status: Home / Self Care | Payer: 59 | Attending: Emergency Medicine | Admitting: Emergency Medicine

## 2020-01-03 ENCOUNTER — Encounter (HOSPITAL_BASED_OUTPATIENT_CLINIC_OR_DEPARTMENT_OTHER): Payer: Self-pay | Admitting: Emergency Medicine

## 2020-01-03 ENCOUNTER — Emergency Department (HOSPITAL_BASED_OUTPATIENT_CLINIC_OR_DEPARTMENT_OTHER): Payer: 59

## 2020-01-03 ENCOUNTER — Other Ambulatory Visit: Payer: Self-pay

## 2020-01-03 DIAGNOSIS — R0789 Other chest pain: Secondary | ICD-10-CM | POA: Diagnosis not present

## 2020-01-03 DIAGNOSIS — R079 Chest pain, unspecified: Secondary | ICD-10-CM

## 2020-01-03 DIAGNOSIS — R0602 Shortness of breath: Secondary | ICD-10-CM | POA: Diagnosis present

## 2020-01-03 LAB — BASIC METABOLIC PANEL
Anion gap: 11 (ref 5–15)
BUN: 15 mg/dL (ref 6–20)
CO2: 25 mmol/L (ref 22–32)
Calcium: 9.2 mg/dL (ref 8.9–10.3)
Chloride: 113 mmol/L — ABNORMAL HIGH (ref 98–111)
Creatinine, Ser: 0.74 mg/dL (ref 0.44–1.00)
GFR calc Af Amer: >60 mL/min (ref >60)
GFR calc non Af Amer: >60 mL/min (ref >60)
Glucose, Bld: 104 mg/dL — ABNORMAL HIGH (ref 70–99)
Potassium: 4.5 mmol/L (ref 3.5–5.1)
Sodium: 149 mmol/L — ABNORMAL HIGH (ref 135–145)

## 2020-01-03 LAB — CBC
HCT: 37.6 % (ref 36.0–46.0)
Hemoglobin: 11.3 g/dL — ABNORMAL LOW (ref 12.0–15.0)
MCH: 21.4 pg — ABNORMAL LOW (ref 26.0–34.0)
MCHC: 30.1 g/dL (ref 30.0–36.0)
MCV: 71.3 fL — ABNORMAL LOW (ref 80.0–100.0)
Platelets: 409 10*3/uL — ABNORMAL HIGH (ref 150–400)
RBC: 5.27 MIL/uL — ABNORMAL HIGH (ref 3.87–5.11)
RDW: 15 % (ref 11.5–15.5)
WBC: 4.8 10*3/uL (ref 4.0–10.5)
nRBC: 0 % (ref 0.0–0.2)

## 2020-01-03 LAB — D-DIMER, QUANTITATIVE: D-Dimer, Quant: <0.27 ug/mL-FEU (ref 0.00–0.50)

## 2020-01-03 LAB — TROPONIN I (HIGH SENSITIVITY): Troponin I (High Sensitivity): <2 ng/L (ref <18)

## 2020-01-03 MED ORDER — IBUPROFEN 400 MG PO TABS
400.0000 mg | ORAL_TABLET | Freq: Once | ORAL | Status: AC
  Administered 2020-01-03: 400 mg via ORAL
  Filled 2020-01-03: qty 1

## 2020-01-03 MED ORDER — KETOROLAC TROMETHAMINE 60 MG/2ML IM SOLN: 60.0000 mg | Freq: Once | INTRAMUSCULAR | Status: DC

## 2020-01-03 NOTE — ED Provider Notes (Signed)
[facility] EMERGENCY DEPARTMENT Provider Note   CSN: [id number] Arrival date & time: 01/03/20  3734     History Chief Complaint  Patient presents with  . Chest Pain  . Shortness of Breath    Yadira Hada is a 24 y.o. female.  HPI      Presents with a few weeks of chest pain, shortness of breath, this week was continuing  Sharp pain and a throbbing, comes and goes, both sides of chest, lasts 10-15 minutes, goes away and comes back, not worse with deep breaths, changes in position, spontaneously No fevers or cough Dyspnea comes and goes spontaneously, lasts for a few minutes then goes away For a few weeks No leg pain or swelling  Started having low back pain has been going on for a while, sharp pains come and go in lower back.   No long trips car or airplane Taking OCPs No family history of blood clots or early heart disease  occ etoh, no smoking or other drugs   History reviewed. No pertinent past medical history.  Patient Active Problem List   Diagnosis Date Noted  . Sports physical 01/02/2013    Past Surgical History:  Procedure Laterality Date  . HERNIA REPAIR    . KNEE SURGERY       OB History   No obstetric history on file.     Family History  Problem Relation Age of Onset  . Sudden death Neg Hx   . Heart attack Neg Hx     Social History   Tobacco Use  . Smoking status: Never Smoker  . Smokeless tobacco: Never Used  Vaping Use  . Vaping Use: Never used  Substance Use Topics  . Alcohol use: Yes  . Drug use: No    Home Medications Prior to Admission medications   Medication Sig Start Date End Date Taking? Authorizing Provider  dextromethorphan-guaiFENesin (MUCINEX DM) 30-600 MG 12hr tablet Take 1 tablet by mouth 2 (two) times daily as needed for cough. 07/14/16   Lyndal Pulley, MD  HYDROcodone-acetaminophen (HYCET) 7.5-325 mg/15 ml solution Take 10 mLs by mouth 4 (four) times daily as needed for moderate pain or severe pain.  12/16/16   Trixie Dredge, PA-C  loratadine (CLARITIN) 10 MG tablet Take 1 tablet (10 mg total) by mouth daily. 07/14/16   Lyndal Pulley, MD    Allergies    Patient has no known allergies.  Review of Systems   Review of Systems  Constitutional: Negative for fever.  HENT: Negative for sore throat.   Eyes: Negative for visual disturbance.  Respiratory: Positive for shortness of breath. Negative for cough.   Cardiovascular: Positive for chest pain.  Gastrointestinal: Positive for diarrhea (two episodes, one day earlier this week) and nausea. Negative for abdominal pain and vomiting.  Genitourinary: Negative for difficulty urinating.  Musculoskeletal: Negative for back pain and neck pain.  Skin: Negative for rash.  Neurological: Negative for syncope and headaches.    Physical Exam Updated Vital Signs BP 115/64   Pulse 80   Temp 98.3 F (36.8 C) (Oral)   Resp 16   Ht 5\' 2"  (1.575 m)   Wt 77.1 kg   LMP 12/30/2019 (Exact Date)   SpO2 100%   BMI 31.09 kg/m   Physical Exam Vitals and nursing note reviewed.  Constitutional:      General: She is not in acute distress.    Appearance: She is well-developed. She is not diaphoretic.  HENT:     Head:  Normocephalic and atraumatic.  Eyes:     Conjunctiva/sclera: Conjunctivae normal.  Cardiovascular:     Rate and Rhythm: Normal rate and regular rhythm.     Heart sounds: Normal heart sounds. No murmur heard.  No friction rub. No gallop.   Pulmonary:     Effort: Pulmonary effort is normal. No respiratory distress.     Breath sounds: Normal breath sounds. No wheezing or rales.  Abdominal:     General: There is no distension.     Palpations: Abdomen is soft.     Tenderness: There is no abdominal tenderness. There is no guarding.  Musculoskeletal:        General: No tenderness.     Cervical back: Normal range of motion.  Skin:    General: Skin is warm and dry.     Findings: No erythema or rash.  Neurological:     Mental Status: She  is alert and oriented to person, place, and time.     ED Results / Procedures / Treatments   Labs (all labs ordered are listed, but only abnormal results are displayed) Labs Reviewed  BASIC METABOLIC PANEL - Abnormal; Notable for the following components:      Result Value   Sodium 149 (*)    Chloride 113 (*)    Glucose, Bld 104 (*)    All other components within normal limits  CBC - Abnormal; Notable for the following components:   RBC 5.27 (*)    Hemoglobin 11.3 (*)    MCV 71.3 (*)    MCH 21.4 (*)    Platelets 409 (*)    All other components within normal limits  D-DIMER, QUANTITATIVE (NOT AT [facility])  TROPONIN I (HIGH SENSITIVITY)    EKG EKG Interpretation  Date/Time:  Thursday January 03 2020 08:40:30 EDT Ventricular Rate:  74 PR Interval:  162 QRS Duration: 84 QT Interval:  370 QTC Calculation: 410 R Axis:   54 Text Interpretation: Normal sinus rhythm Normal ECG No significant change since last tracing Confirmed by Alvira Monday ([id number]) on 01/03/2020 9:36:30 AM   Radiology DG Chest 2 View  Result Date: 01/03/2020 CLINICAL DATA:  Chest pain EXAM: CHEST - 2 VIEW COMPARISON:  None. FINDINGS: The heart size and mediastinal contours are within normal limits. Both lungs are clear. The visualized skeletal structures are unremarkable. IMPRESSION: No active cardiopulmonary disease. Electronically Signed   By: Signa Kell M.D.   On: 01/03/2020 09:59    Procedures Procedures (including critical care time)  Medications Ordered in ED Medications  ibuprofen (ADVIL) tablet 400 mg (400 mg Oral Given 01/03/20 1200)    ED Course  I have reviewed the triage vital signs and the nursing notes.  Pertinent labs & imaging results that were available during my care of the patient were reviewed by me and considered in my medical decision making (see chart for details).    MDM Rules/Calculators/A&P                          24yo female presents with concern for chest pain and  dyspnea. Differential diagnosis for chest pain includes pulmonary embolus, dissection, pneumothorax, pneumonia, ACS, myocarditis, pericarditis.  EKG was done and evaluate by me and showed no acute ST changes and no signs of pericarditis. Chest x-ray was done and evaluated by me and radiology and showed no sign of pneumonia or pneumothorax. Patient is low risk [place] with a negative ddimer and have low suspicion for PE.  Patient is low risk HEART score and had single troponin after more than 6hr of pain which is negative and doubt ACS. Do not feel history or exam are consistent with aortic dissection.   Recommend follow up with PCP regarding symptoms.     Final Clinical Impression(s) / ED Diagnoses Final diagnoses:  Chest pain, unspecified type    Rx / DC Orders ED Discharge Orders    None       Gareth Morgan, MD 01/03/20 2218

## 2020-01-03 NOTE — ED Notes (Signed)
Patient transported to X-ray 

## 2020-01-03 NOTE — ED Triage Notes (Signed)
Chest pain for several weeks, endorses SOB on exertion, intermittent sharp pains to lower back.

## 2020-01-03 NOTE — ED Notes (Signed)
Pt requests some ibuprofen if possible; note sent to EDP re: same.

## 2021-03-12 ENCOUNTER — Encounter (HOSPITAL_BASED_OUTPATIENT_CLINIC_OR_DEPARTMENT_OTHER): Payer: Self-pay

## 2021-03-12 ENCOUNTER — Other Ambulatory Visit: Payer: Self-pay

## 2021-03-12 ENCOUNTER — Emergency Department (HOSPITAL_BASED_OUTPATIENT_CLINIC_OR_DEPARTMENT_OTHER)
Admission: EM | Admit: 2021-03-12 | Discharge: 2021-03-12 | Disposition: A | Discharge status: Home / Self Care | Payer: Medicaid Other | Attending: Emergency Medicine | Admitting: Emergency Medicine

## 2021-03-12 DIAGNOSIS — O99891 Other specified diseases and conditions complicating pregnancy: Secondary | ICD-10-CM

## 2021-03-12 DIAGNOSIS — Z3491 Encounter for supervision of normal pregnancy, unspecified, first trimester: Secondary | ICD-10-CM

## 2021-03-12 DIAGNOSIS — R1030 Lower abdominal pain, unspecified: Secondary | ICD-10-CM | POA: Insufficient documentation

## 2021-03-12 DIAGNOSIS — Z3A01 Less than 8 weeks gestation of pregnancy: Secondary | ICD-10-CM | POA: Diagnosis not present

## 2021-03-12 DIAGNOSIS — O26891 Other specified pregnancy related conditions, first trimester: Secondary | ICD-10-CM | POA: Diagnosis present

## 2021-03-12 LAB — URINALYSIS, ROUTINE W REFLEX MICROSCOPIC
Bilirubin Urine: NEGATIVE
Glucose, UA: NEGATIVE mg/dL
Hgb urine dipstick: NEGATIVE
Ketones, ur: NEGATIVE mg/dL
Nitrite: NEGATIVE
Protein, ur: NEGATIVE mg/dL
Specific Gravity, Urine: 1.025 (ref 1.005–1.030)
pH: 6 (ref 5.0–8.0)

## 2021-03-12 LAB — URINALYSIS, MICROSCOPIC (REFLEX)

## 2021-03-12 LAB — PREGNANCY, URINE: Preg Test, Ur: POSITIVE — AB

## 2021-03-12 MED ORDER — CEPHALEXIN 250 MG PO CAPS
500.0000 mg | ORAL_CAPSULE | Freq: Once | ORAL | Status: AC
  Administered 2021-03-12: 500 mg via ORAL
  Filled 2021-03-12: qty 2

## 2021-03-12 MED ORDER — CEPHALEXIN 500 MG PO CAPS: 500.0000 mg | ORAL_CAPSULE | Freq: Three times a day (TID) | ORAL | 0 refills | Status: DC

## 2021-03-12 NOTE — ED Provider Notes (Signed)
[facility] EMERGENCY DEPARTMENT Provider Note   CSN: [id number] Arrival date & time: 03/12/21  1820     History Chief Complaint  Patient presents with   Abdominal Pain    Linda French is a 25 y.o. female G3P1 here presenting with lower abdominal pain.  Patient states that she may be about [redacted] weeks pregnant.  She states that she has some lower abdominal cramps.  Denies any vaginal bleeding or spotting.  She states that she may be pregnant and wants to get an ultrasound to confirm.  She states that she made an appointment with OB on September 13.  She was concerned because she had previous miscarriage  The history is provided by the patient.      History reviewed. No pertinent past medical history.  Patient Active Problem List   Diagnosis Date Noted   Sports physical 01/02/2013    Past Surgical History:  Procedure Laterality Date   HERNIA REPAIR     KNEE SURGERY       OB History     Gravida  1   Para      Term      Preterm      AB      Living         SAB      IAB      Ectopic      Multiple      Live Births              Family History  Problem Relation Age of Onset   Sudden death Neg Hx    Heart attack Neg Hx     Social History   Tobacco Use   Smoking status: Never   Smokeless tobacco: Never  Vaping Use   Vaping Use: Never used  Substance Use Topics   Alcohol use: Not Currently   Drug use: No    Home Medications Prior to Admission medications   Medication Sig Start Date End Date Taking? Authorizing Provider  dextromethorphan-guaiFENesin (MUCINEX DM) 30-600 MG 12hr tablet Take 1 tablet by mouth 2 (two) times daily as needed for cough. 07/14/16   Lyndal Pulley, MD  HYDROcodone-acetaminophen (HYCET) 7.5-325 mg/15 ml solution Take 10 mLs by mouth 4 (four) times daily as needed for moderate pain or severe pain. 12/16/16   Trixie Dredge, PA-C  loratadine (CLARITIN) 10 MG tablet Take 1 tablet (10 mg total) by mouth daily. 07/14/16    Lyndal Pulley, MD    Allergies    Patient has no known allergies.  Review of Systems   Review of Systems  Gastrointestinal:  Positive for abdominal pain.  All other systems reviewed and are negative.  Physical Exam Updated Vital Signs BP 121/69 (BP Location: Left Arm)   Pulse 88   Temp 98.7 F (37.1 C) (Oral)   Resp 18   Ht 5\' 2"  (1.575 m)   Wt 80.7 kg   LMP 01/27/2021   SpO2 100%   BMI 32.56 kg/m   Physical Exam Vitals and nursing note reviewed.  Constitutional:      Appearance: She is well-developed.  HENT:     Head: Normocephalic.  Eyes:     Extraocular Movements: Extraocular movements intact.  Cardiovascular:     Rate and Rhythm: Normal rate and regular rhythm.     Heart sounds: Normal heart sounds.  Pulmonary:     Effort: Pulmonary effort is normal.     Breath sounds: Normal breath sounds.  Abdominal:  General: Abdomen is flat.     Palpations: Abdomen is soft.     Comments: No abdominal tenderness  Skin:    Capillary Refill: Capillary refill takes less than 2 seconds.  Neurological:     General: No focal deficit present.     Mental Status: She is alert and oriented to person, place, and time.  Psychiatric:        Mood and Affect: Mood normal.        Behavior: Behavior normal.    ED Results / Procedures / Treatments   Labs (all labs ordered are listed, but only abnormal results are displayed) Labs Reviewed  PREGNANCY, URINE - Abnormal; Notable for the following components:      Result Value   Preg Test, Ur POSITIVE (*)    All other components within normal limits  URINALYSIS, ROUTINE W REFLEX MICROSCOPIC - Abnormal; Notable for the following components:   APPearance CLOUDY (*)    Leukocytes,Ua SMALL (*)    All other components within normal limits  URINALYSIS, MICROSCOPIC (REFLEX) - Abnormal; Notable for the following components:   Bacteria, UA MANY (*)    All other components within normal limits    EKG None  Radiology No results  found.  Procedures Procedures   EMERGENCY DEPARTMENT [place] PREGNANCY "Study: Limited Ultrasound of the Pelvis for Pregnancy"  INDICATIONS:Pregnancy(required) Multiple views of the uterus and pelvic cavity were obtained in real-time with a multi-frequency probe.  APPROACH:Transabdominal  PERFORMED BY: Myself IMAGES ARCHIVED?: Yes LIMITATIONS: Body habitus PREGNANCY FREE FLUID: None ADNEXAL FINDINGS:Right ovarion cyst GESTATIONAL AGE, ESTIMATE: 7 weeks 1 day FETAL HEART RATE: 150 INTERPRETATION: Intrauterine gestational sac noted, live IUP     Medications Ordered in ED Medications  cephALEXin (KEFLEX) capsule 500 mg (has no administration in time range)    ED Course  I have reviewed the triage vital signs and the nursing notes.  Pertinent labs & imaging results that were available during my care of the patient were reviewed by me and considered in my medical decision making (see chart for details).    MDM Rules/Calculators/A&P                           Linda French is a 25 y.o. female here with lower abdominal cramps and concern for possible pregnancy.  Patient has a positive UCG.  Patient has no vaginal bleeding.  Ultrasound confirms 7-week pregnancy with normal fetal heart rate.  Patient does have bacteriuria so we will treat with Keflex.  She already has OB follow-up and already taking prenatal vitamins.  Gave strict return precautions  Final Clinical Impression(s) / ED Diagnoses Final diagnoses:  None    Rx / DC Orders ED Discharge Orders     None        Charlynne Pander, MD 03/12/21 2127

## 2021-03-12 NOTE — Discharge Instructions (Addendum)
Please continue taking a prenatal vitamin  You have a normal pregnancy right now.  You do have some bacteria in your urine so please take Keflex 3 times daily  Please see your OB doctor.  Return to ER if you have worse abdominal pain, vomiting, vaginal bleeding

## 2021-03-12 NOTE — ED Triage Notes (Signed)
Pt states that she is having abdominal pain X1 week reports that she thinks she may be around [redacted] weeks pregnant, LMP July 12th. Denies any vaginal bleeding or discharge. Pt reports pain in lower abdomen.

## 2021-06-01 ENCOUNTER — Other Ambulatory Visit: Payer: Self-pay

## 2021-06-01 ENCOUNTER — Emergency Department (HOSPITAL_BASED_OUTPATIENT_CLINIC_OR_DEPARTMENT_OTHER)
Admission: EM | Admit: 2021-06-01 | Discharge: 2021-06-01 | Disposition: A | Discharge status: Home / Self Care | Payer: Medicaid Other | Attending: Emergency Medicine | Admitting: Emergency Medicine

## 2021-06-01 ENCOUNTER — Encounter (HOSPITAL_BASED_OUTPATIENT_CLINIC_OR_DEPARTMENT_OTHER): Payer: Self-pay | Admitting: *Deleted

## 2021-06-01 DIAGNOSIS — Y92410 Unspecified street and highway as the place of occurrence of the external cause: Secondary | ICD-10-CM | POA: Diagnosis not present

## 2021-06-01 DIAGNOSIS — Z3A17 17 weeks gestation of pregnancy: Secondary | ICD-10-CM | POA: Diagnosis not present

## 2021-06-01 DIAGNOSIS — M546 Pain in thoracic spine: Secondary | ICD-10-CM | POA: Diagnosis not present

## 2021-06-01 DIAGNOSIS — O99891 Other specified diseases and conditions complicating pregnancy: Secondary | ICD-10-CM | POA: Diagnosis not present

## 2021-06-01 DIAGNOSIS — O26892 Other specified pregnancy related conditions, second trimester: Secondary | ICD-10-CM | POA: Diagnosis present

## 2021-06-01 NOTE — Discharge Instructions (Addendum)
You may take Tylenol as directed for pain.  You may alternate ice or heat as needed for pain.  Maintain follow-up with OB/GYN specialist on Thursday, 06/04/2021. Return to the emergency department if you are experiencing increasing or worsening abdominal pain, cramping, vaginal bleeding.
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
## 2021-11-30 IMAGING — CR DG CHEST 2V
2 series · 2 of 2 positions shown · non-contrast
Comparison: None.

CLINICAL DATA: Chest pain

EXAM:
CHEST - 2 VIEW

[w chest pa]
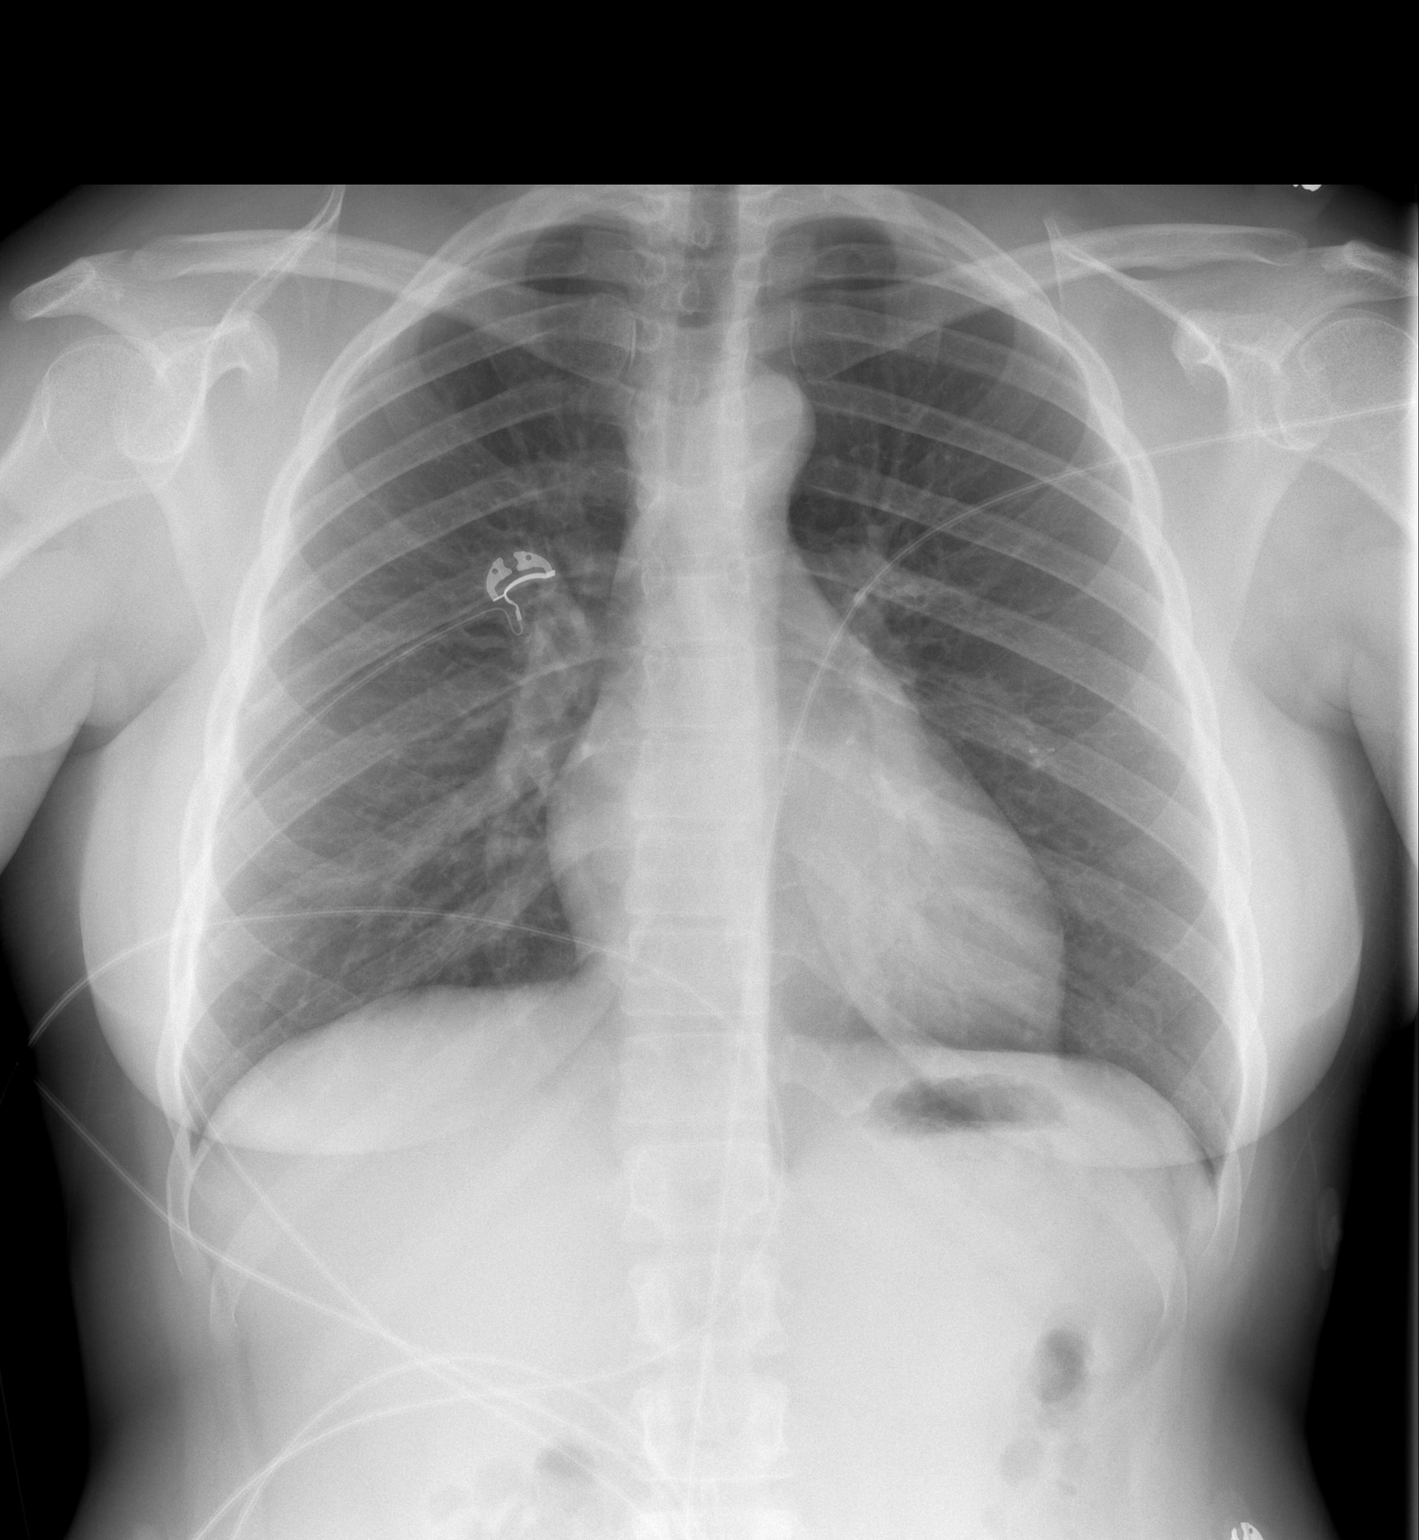

[w chest lat]
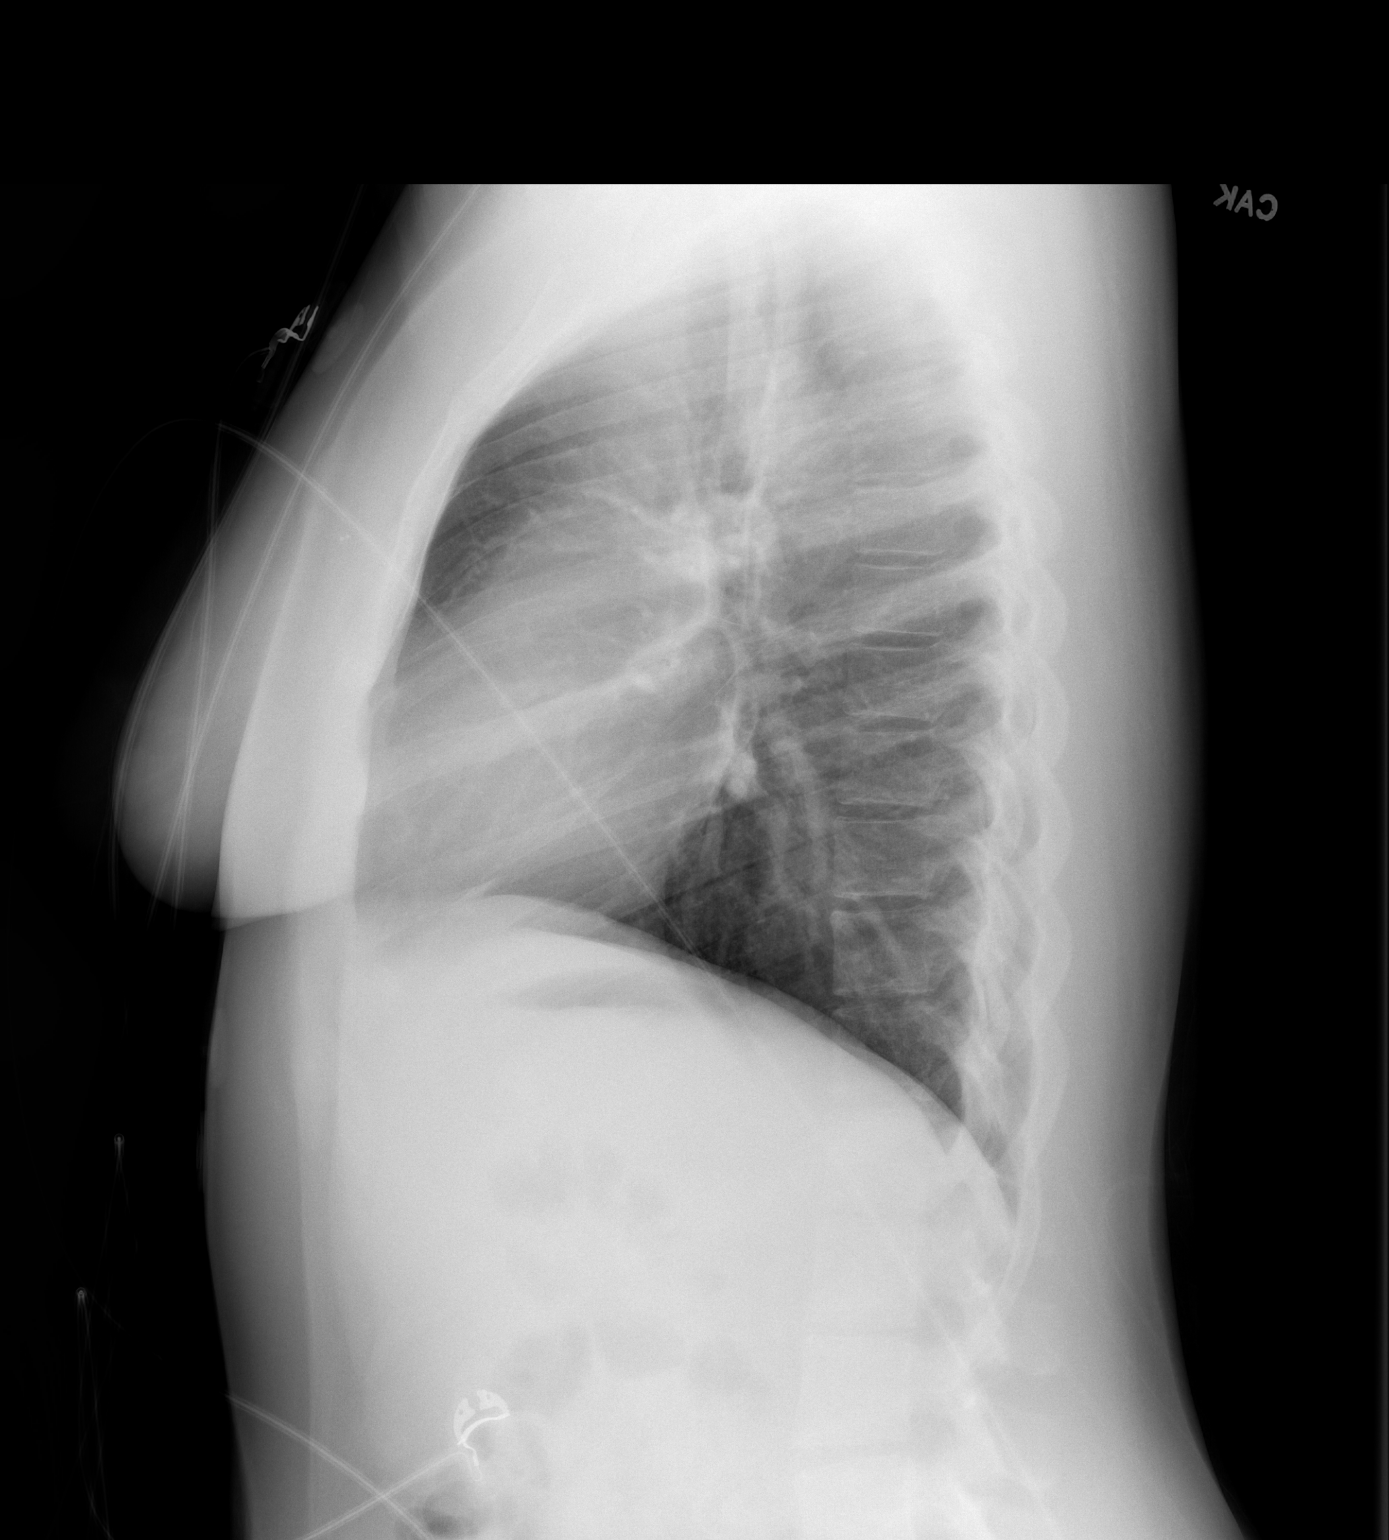

[2 of 2 positions shown; findings below may reference images not displayed]

FINDINGS: The heart size and mediastinal contours are within normal limits.
Both lungs are clear. The visualized skeletal structures are
unremarkable.
IMPRESSION: No active cardiopulmonary disease.
